# Patient Record
Sex: Female | Born: 1950 | Marital: Married | Smoking: Former smoker
Health system: Southern US, Community
[De-identification: ages and names within clinical notes are randomized; demographics above are authoritative.]

## PROBLEM LIST (undated history)

## (undated) DIAGNOSIS — E785 Hyperlipidemia, unspecified: Secondary | ICD-10-CM

## (undated) DIAGNOSIS — I10 Essential (primary) hypertension: Secondary | ICD-10-CM

---

## 2011-03-14 ENCOUNTER — Other Ambulatory Visit (HOSPITAL_COMMUNITY): Payer: Self-pay | Admitting: Obstetrics and Gynecology

## 2011-03-14 DIAGNOSIS — Z1231 Encounter for screening mammogram for malignant neoplasm of breast: Secondary | ICD-10-CM

## 2011-03-20 ENCOUNTER — Ambulatory Visit (HOSPITAL_COMMUNITY)
Admission: RE | Admit: 2011-03-20 | Discharge: 2011-03-20 | Disposition: A | Discharge status: Home / Self Care | Payer: BC Managed Care – PPO | Source: Ambulatory Visit | Attending: Obstetrics and Gynecology | Admitting: Obstetrics and Gynecology

## 2011-03-20 DIAGNOSIS — Z1231 Encounter for screening mammogram for malignant neoplasm of breast: Secondary | ICD-10-CM | POA: Insufficient documentation

## 2012-03-26 ENCOUNTER — Other Ambulatory Visit (HOSPITAL_COMMUNITY): Payer: Self-pay | Admitting: Obstetrics and Gynecology

## 2012-03-26 DIAGNOSIS — Z803 Family history of malignant neoplasm of breast: Secondary | ICD-10-CM

## 2012-04-16 ENCOUNTER — Ambulatory Visit (HOSPITAL_COMMUNITY)
Admission: RE | Admit: 2012-04-16 | Discharge: 2012-04-16 | Disposition: A | Discharge status: Home / Self Care | Payer: BC Managed Care – PPO | Source: Ambulatory Visit | Attending: Obstetrics and Gynecology | Admitting: Obstetrics and Gynecology

## 2012-04-16 DIAGNOSIS — Z1231 Encounter for screening mammogram for malignant neoplasm of breast: Secondary | ICD-10-CM | POA: Insufficient documentation

## 2012-04-16 DIAGNOSIS — Z803 Family history of malignant neoplasm of breast: Secondary | ICD-10-CM

## 2013-06-17 ENCOUNTER — Other Ambulatory Visit (HOSPITAL_COMMUNITY): Payer: Self-pay | Admitting: Obstetrics and Gynecology

## 2013-06-17 DIAGNOSIS — Z1231 Encounter for screening mammogram for malignant neoplasm of breast: Secondary | ICD-10-CM

## 2013-06-26 ENCOUNTER — Ambulatory Visit (HOSPITAL_COMMUNITY): Payer: BC Managed Care – PPO

## 2013-07-01 ENCOUNTER — Ambulatory Visit (HOSPITAL_COMMUNITY)
Admission: RE | Admit: 2013-07-01 | Discharge: 2013-07-01 | Disposition: A | Discharge status: Home / Self Care | Payer: BC Managed Care – PPO | Source: Ambulatory Visit | Attending: Obstetrics and Gynecology | Admitting: Obstetrics and Gynecology

## 2013-07-01 DIAGNOSIS — Z1231 Encounter for screening mammogram for malignant neoplasm of breast: Secondary | ICD-10-CM | POA: Insufficient documentation

## 2013-08-18 ENCOUNTER — Emergency Department (HOSPITAL_BASED_OUTPATIENT_CLINIC_OR_DEPARTMENT_OTHER)
Admission: EM | Admit: 2013-08-18 | Discharge: 2013-08-18 | Disposition: A | Discharge status: Home / Self Care | Payer: BC Managed Care – PPO | Attending: Emergency Medicine | Admitting: Emergency Medicine

## 2013-08-18 ENCOUNTER — Encounter (HOSPITAL_BASED_OUTPATIENT_CLINIC_OR_DEPARTMENT_OTHER): Payer: Self-pay | Admitting: Emergency Medicine

## 2013-08-18 DIAGNOSIS — Z87891 Personal history of nicotine dependence: Secondary | ICD-10-CM | POA: Insufficient documentation

## 2013-08-18 DIAGNOSIS — N390 Urinary tract infection, site not specified: Secondary | ICD-10-CM | POA: Insufficient documentation

## 2013-08-18 DIAGNOSIS — Z880 Allergy status to penicillin: Secondary | ICD-10-CM | POA: Insufficient documentation

## 2013-08-18 DIAGNOSIS — I10 Essential (primary) hypertension: Secondary | ICD-10-CM | POA: Insufficient documentation

## 2013-08-18 DIAGNOSIS — E785 Hyperlipidemia, unspecified: Secondary | ICD-10-CM | POA: Insufficient documentation

## 2013-08-18 DIAGNOSIS — Z79899 Other long term (current) drug therapy: Secondary | ICD-10-CM | POA: Insufficient documentation

## 2013-08-18 HISTORY — DX: Hyperlipidemia, unspecified: E78.5

## 2013-08-18 HISTORY — DX: Essential (primary) hypertension: I10

## 2013-08-18 LAB — URINALYSIS, ROUTINE W REFLEX MICROSCOPIC
Bilirubin Urine: NEGATIVE
Glucose, UA: NEGATIVE mg/dL
Ketones, ur: 15 mg/dL — AB
NITRITE: NEGATIVE
Protein, ur: 100 mg/dL — AB
SPECIFIC GRAVITY, URINE: 1.008 (ref 1.005–1.030)
UROBILINOGEN UA: 0.2 mg/dL (ref 0.0–1.0)
pH: 5.5 (ref 5.0–8.0)

## 2013-08-18 LAB — URINE MICROSCOPIC-ADD ON

## 2013-08-18 MED ORDER — PHENAZOPYRIDINE HCL 200 MG PO TABS: 200.0000 mg | ORAL_TABLET | Freq: Three times a day (TID) | ORAL | Status: AC

## 2013-08-18 MED ORDER — CIPROFLOXACIN HCL 500 MG PO TABS
500.0000 mg | ORAL_TABLET | Freq: Once | ORAL | Status: AC
  Administered 2013-08-18: 500 mg via ORAL
  Filled 2013-08-18: qty 1

## 2013-08-18 MED ORDER — PHENAZOPYRIDINE HCL 100 MG PO TABS
200.0000 mg | ORAL_TABLET | Freq: Once | ORAL | Status: AC
  Administered 2013-08-18: 200 mg via ORAL
  Filled 2013-08-18: qty 2

## 2013-08-18 MED ORDER — NITROFURANTOIN MONOHYD MACRO 100 MG PO CAPS: 100.0000 mg | ORAL_CAPSULE | Freq: Two times a day (BID) | ORAL | Status: AC

## 2013-08-18 NOTE — ED Notes (Signed)
Pt c/o discomfort and blood with urination

## 2013-08-18 NOTE — ED Provider Notes (Signed)
CSN: [id number]     Arrival date & time 08/18/13  0258 History   First MD Initiated Contact with Patient 08/18/13 [phone]     Chief Complaint  Patient presents with  . Dysuria     (Consider location/radiation/quality/duration/timing/severity/associated sxs/prior Treatment) Patient is a 63 y.o. female presenting with dysuria. The history is provided by the patient.  Dysuria Pain quality:  Aching and burning Pain severity:  Moderate Onset quality:  Gradual Timing:  Constant Progression:  Unchanged Chronicity:  Recurrent Recent urinary tract infections: yes   Relieved by:  Nothing Worsened by:  Nothing tried Ineffective treatments:  None tried Urinary symptoms: hematuria   Associated symptoms: no genital lesions   Risk factors: no hx of pyelonephritis     Past Medical History  Diagnosis Date  . Hypertension   . Hyperlipemia    No past surgical history on file. No family history on file. History  Substance Use Topics  . Smoking status: Former Games developer  . Smokeless tobacco: Never Used  . Alcohol Use: Yes   OB History   Grav Para Term Preterm Abortions TAB SAB Ect Mult Living                 Review of Systems  Genitourinary: Positive for dysuria.  All other systems reviewed and are negative.      Allergies  Penicillins and Sulfa antibiotics  Home Medications   Current Outpatient Rx  Name  Route  Sig  Dispense  Refill  . losartan (COZAAR) 100 MG tablet   Oral   Take 50 mg by mouth daily.         . rosuvastatin (CRESTOR) 10 MG tablet   Oral   Take 10 mg by mouth daily.         Marland Kitchen venlafaxine XR (EFFEXOR-XR) 150 MG 24 hr capsule   Oral   Take 150 mg by mouth daily with breakfast.         . nitrofurantoin, macrocrystal-monohydrate, (MACROBID) 100 MG capsule   Oral   Take 1 capsule (100 mg total) by mouth 2 (two) times daily. X 7 days   14 capsule   0   . phenazopyridine (PYRIDIUM) 200 MG tablet   Oral   Take 1 tablet (200 mg total) by mouth 3  (three) times daily.   6 tablet   0    BP 152/80  Pulse 86  Temp(Src) 97.8 F (36.6 C) (Oral)  Resp 18  SpO2 97% Physical Exam  Constitutional: She is oriented to person, place, and time. She appears well-developed and well-nourished.  HENT:  Head: Normocephalic and atraumatic.  Mouth/Throat: Oropharynx is clear and moist.  Eyes: Conjunctivae are normal. Pupils are equal, round, and reactive to light.  Neck: Normal range of motion. Neck supple.  Cardiovascular: Normal rate, regular rhythm and intact distal pulses.   Pulmonary/Chest: Effort normal and breath sounds normal. She has no wheezes. She has no rales.  Abdominal: Soft. Bowel sounds are normal. There is no tenderness. There is no rebound and no guarding.  Musculoskeletal: Normal range of motion.  Neurological: She is alert and oriented to person, place, and time.  Skin: Skin is warm and dry.  Psychiatric: She has a normal mood and affect.    ED Course  Procedures (including critical care time) Labs Review Labs Reviewed  URINALYSIS, ROUTINE W REFLEX MICROSCOPIC - Abnormal; Notable for the following:    Color, Urine RED (*)    APPearance TURBID (*)    Hgb urine  dipstick LARGE (*)    Ketones, ur 15 (*)    Protein, ur 100 (*)    Leukocytes, UA LARGE (*)    All other components within normal limits  URINE MICROSCOPIC-ADD ON - Abnormal; Notable for the following:    Squamous Epithelial / LPF FEW (*)    Bacteria, UA MANY (*)    All other components within normal limits   Imaging Review No results found.   EKG Interpretation None      MDM   Final diagnoses:  UTI (lower urinary tract infection)    Will treat for UTI with macrobid.      Jasmine AweApril K Keigan Tafoya-Rasch, MD 08/18/13 [phone]

## 2016-10-16 ENCOUNTER — Other Ambulatory Visit (HOSPITAL_BASED_OUTPATIENT_CLINIC_OR_DEPARTMENT_OTHER): Payer: Self-pay | Admitting: Family Medicine

## 2016-10-16 ENCOUNTER — Ambulatory Visit (HOSPITAL_BASED_OUTPATIENT_CLINIC_OR_DEPARTMENT_OTHER)
Admission: RE | Admit: 2016-10-16 | Discharge: 2016-10-16 | Disposition: A | Discharge status: Home / Self Care | Payer: BLUE CROSS/BLUE SHIELD | Source: Ambulatory Visit | Attending: Family Medicine | Admitting: Family Medicine

## 2016-10-16 DIAGNOSIS — M25562 Pain in left knee: Secondary | ICD-10-CM | POA: Diagnosis present

## 2016-10-16 DIAGNOSIS — M25462 Effusion, left knee: Secondary | ICD-10-CM | POA: Insufficient documentation

## 2019-03-05 IMAGING — US US EXTREM LOW VENOUS*L*
1 series · 13 of 24 positions shown · non-contrast
Comparison: Prior radiograph from 10/05/2016.

CLINICAL DATA: Initial evaluation for pain and swelling left knee
for 2 weeks.



[Series 1: us extrem low venous*left* · 0.08mm/px · 26 acquisitions, 13 frames shown]
[im 1/26]
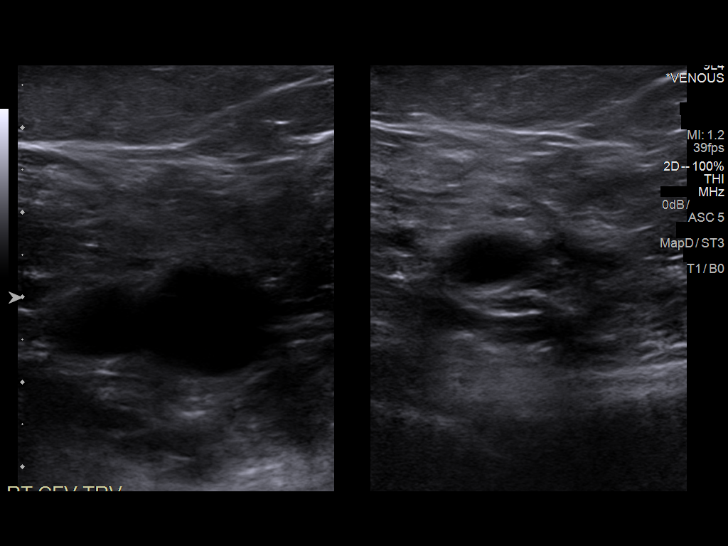
[im 3/26]
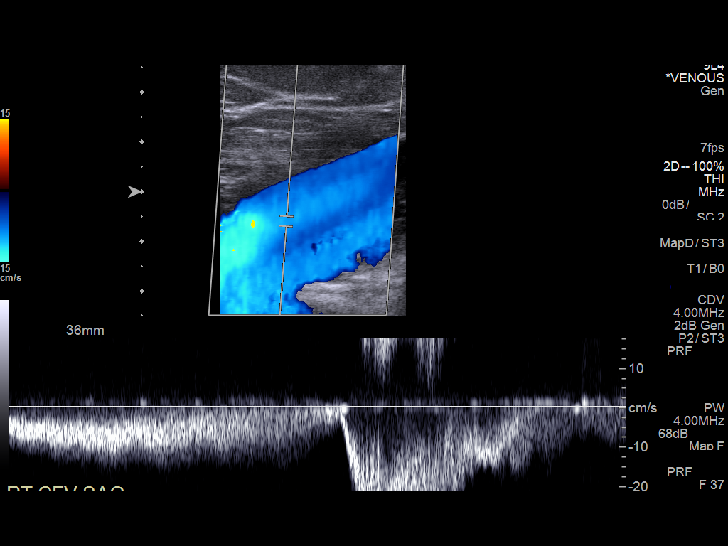
[im 5/26]
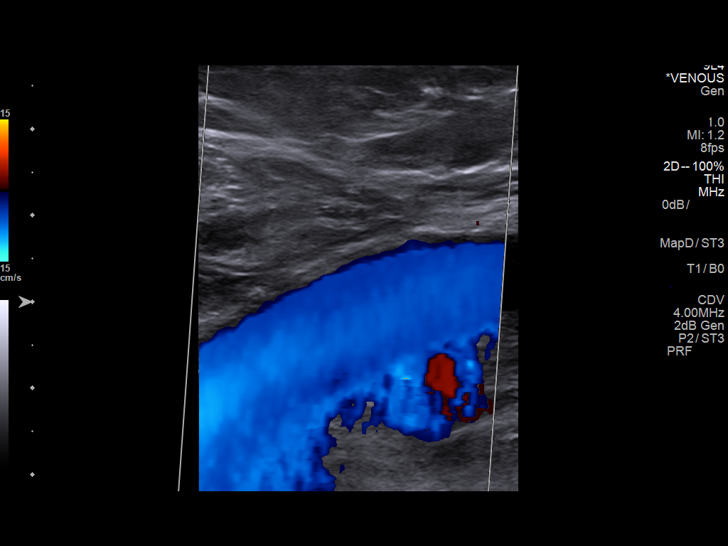
[im 7/26]
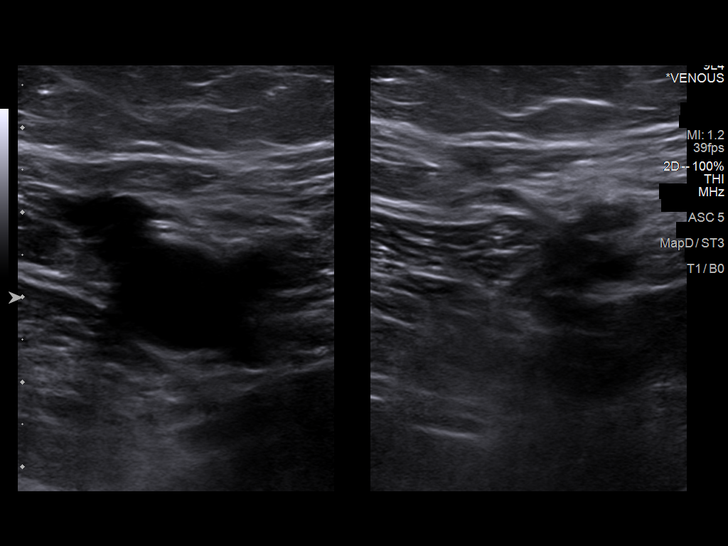
[im 9/26]
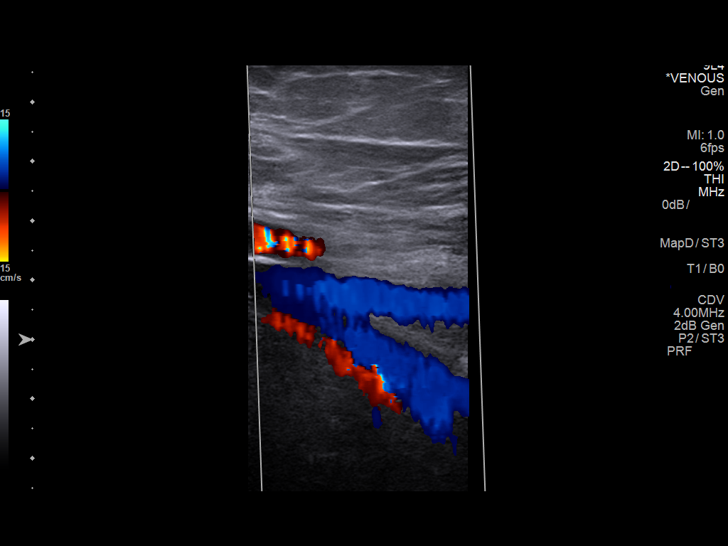
[im 11/26]
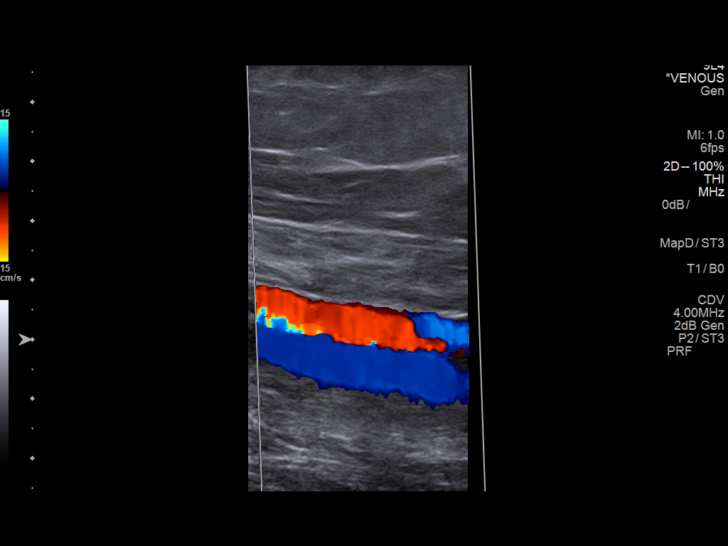
[im 15/26]
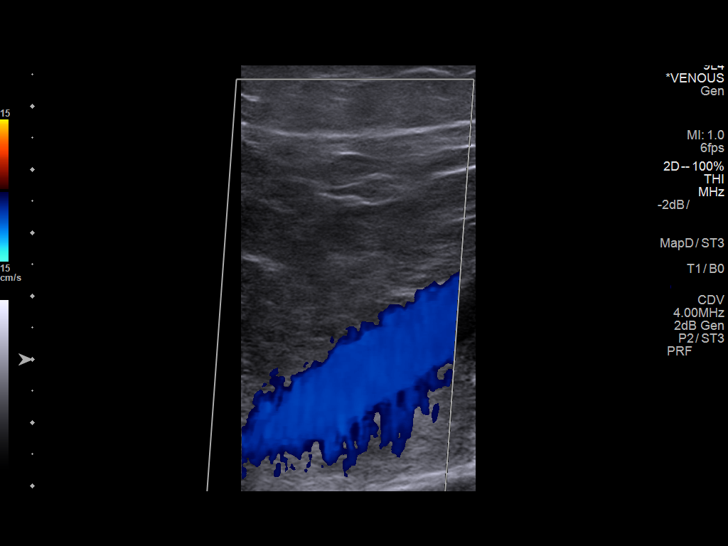
[im 16/26]
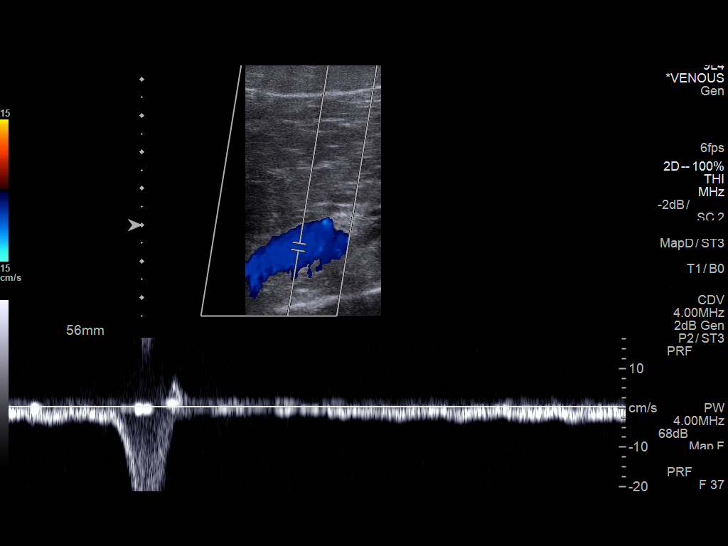
[im 18/26]
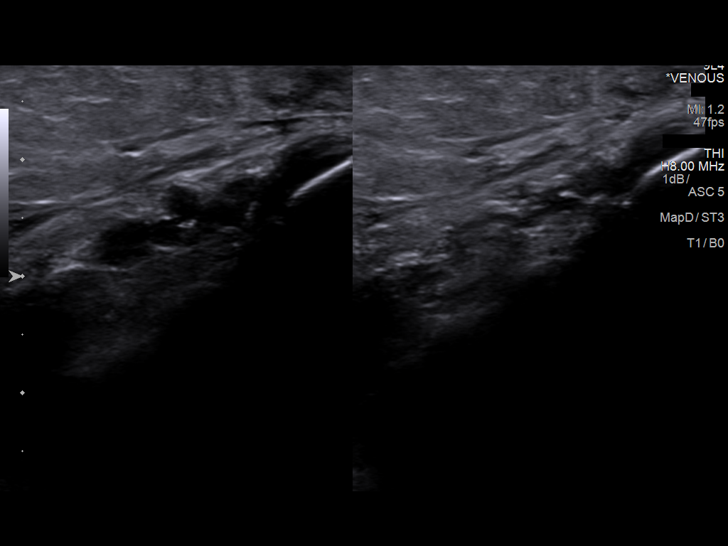
[im 20/26]
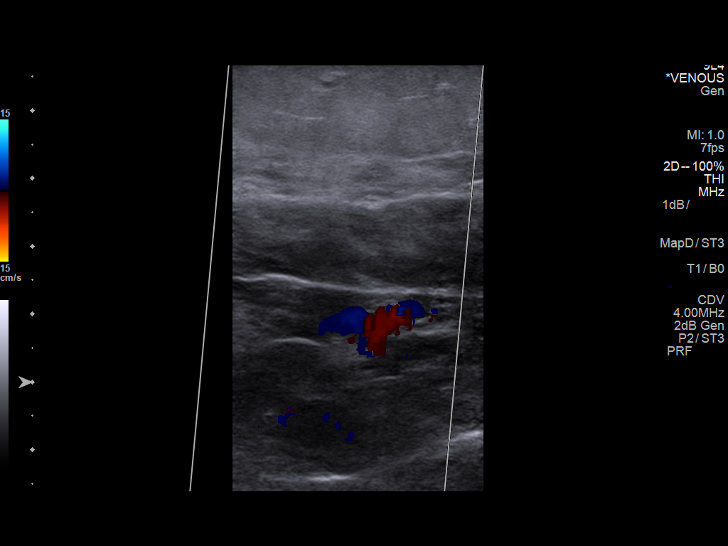
[im 21/26]
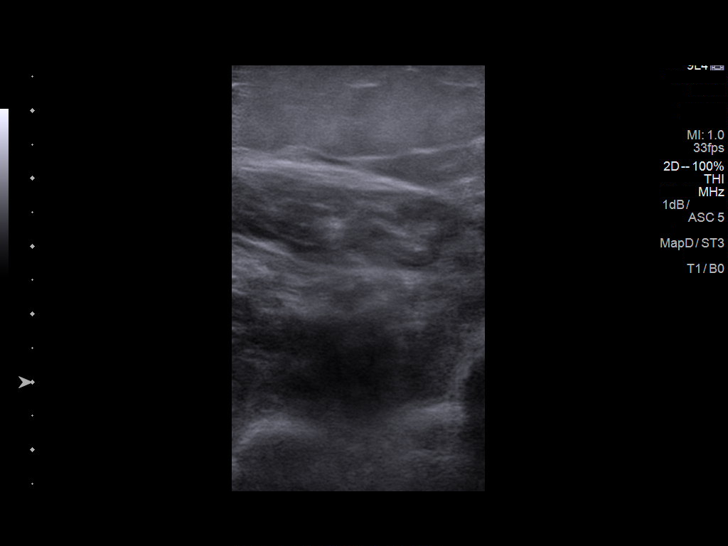
[im 23/26]
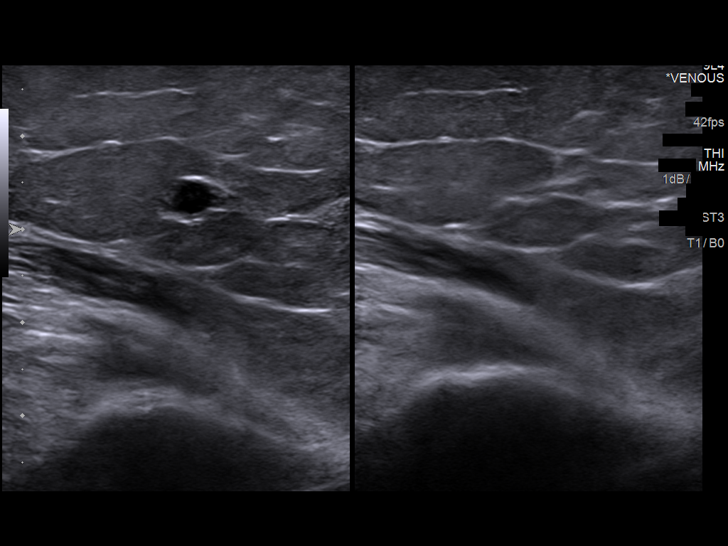
[im 26/26]
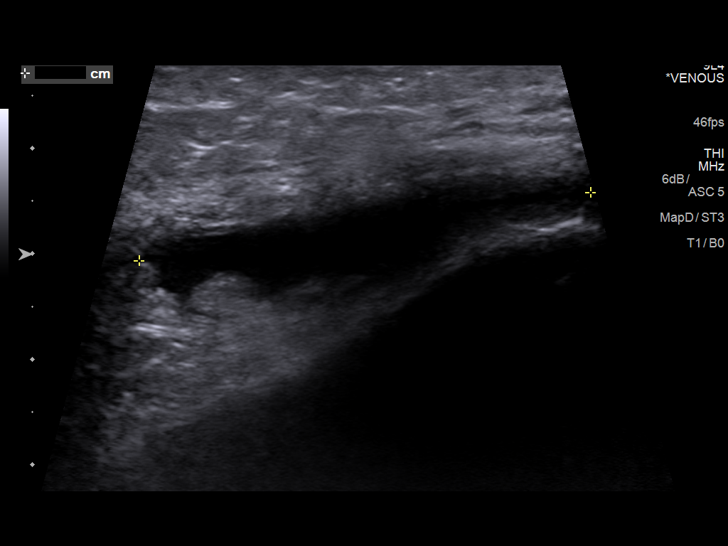

[13 of 24 positions shown; findings below may reference images not displayed]

FINDINGS: Contralateral Common Femoral Vein: Respiratory phasicity is normal
and symmetric with the symptomatic side. No evidence of thrombus.
Normal compressibility.

Common Femoral Vein: No evidence of thrombus. Normal
compressibility, respiratory phasicity and response to augmentation.

Saphenofemoral Junction: No evidence of thrombus. Normal
compressibility and flow on color Doppler imaging.

Profunda Femoral Vein: No evidence of thrombus. Normal
compressibility and flow on color Doppler imaging.

Femoral Vein: No evidence of thrombus. Normal compressibility,
respiratory phasicity and response to augmentation.

Popliteal Vein: No evidence of thrombus. Normal compressibility,
respiratory phasicity and response to augmentation.

Calf Veins: No evidence of thrombus. Normal compressibility and flow
on color Doppler imaging.

Superficial Great Saphenous Vein: No evidence of thrombus. Normal
compressibility and flow on color Doppler imaging.

Other Findings: 4.3 x 0.6 x 2.7 cm anechoic collection seen at the
lateral aspect of the patella/knee joint, indeterminate.
IMPRESSION: 1. No evidence of DVT within the left lower extremity.
2. 4.3 x 0.6 x 2.7 cm anechoic collection at the lateral aspect of
the patella/knee joint, indeterminate. Correlation physical exam
recommended. Additionally, further evaluation with dedicated MRI
could be performed as clinically desired.

## 2019-07-13 ENCOUNTER — Ambulatory Visit: Payer: BC Managed Care – PPO | Attending: Internal Medicine

## 2019-07-13 DIAGNOSIS — Z23 Encounter for immunization: Secondary | ICD-10-CM | POA: Insufficient documentation

## 2019-07-13 NOTE — Progress Notes (Signed)
   Covid-19 Vaccination Clinic  Name:  Kaitlyn Buchanan    MRN: [id number] DOB: 1950/10/16  07/13/2019  [name] was observed post Covid-19 immunization for 15 minutes without incidence. She was provided with Vaccine Information Sheet and instruction to access the V-Safe system.   [name] was instructed to call 911 with any severe reactions post vaccine: Marland Kitchen Difficulty breathing  . Swelling of your face and throat  . A fast heartbeat  . A bad rash all over your body  . Dizziness and weakness    Immunizations Administered    Name Date Dose VIS Date Route   Pfizer COVID-19 Vaccine 07/13/2019  5:05 PM 0.3 mL 05/16/2019 Intramuscular   Manufacturer: [employer], [employer]   Lot: [id number]   NDC: [address]-9

## 2019-07-30 ENCOUNTER — Ambulatory Visit: Payer: BLUE CROSS/BLUE SHIELD

## 2019-08-07 ENCOUNTER — Ambulatory Visit: Payer: BC Managed Care – PPO | Attending: Internal Medicine

## 2019-08-07 DIAGNOSIS — Z23 Encounter for immunization: Secondary | ICD-10-CM

## 2019-08-07 NOTE — Progress Notes (Signed)
   Covid-19 Vaccination Clinic  Name:  [name]    MRN: [id number] DOB: 1951-03-14  08/07/2019  [name] was observed post Covid-19 immunization for 15 minutes without incident. She was provided with Vaccine Information Sheet and instruction to access the V-Safe system.   [name] was instructed to call 911 with any severe reactions post vaccine: Marland Kitchen Difficulty breathing  . Swelling of face and throat  . A fast heartbeat  . A bad rash all over body  . Dizziness and weakness   Immunizations Administered    Name Date Dose VIS Date Route   Pfizer COVID-19 Vaccine 08/07/2019  1:32 PM 0.3 mL 05/16/2019 Intramuscular   Manufacturer: [employer], [employer]   Lot: [id number]   NDC: [address]-3
